# Patient Record
Sex: Male | Born: 1995 | Race: Black or African American | Hispanic: No | Marital: Single | Smoking: Never smoker
Health system: Southern US, Community
[De-identification: ages and names within clinical notes are randomized; demographics above are authoritative.]

## PROBLEM LIST (undated history)

## (undated) DIAGNOSIS — G43909 Migraine, unspecified, not intractable, without status migrainosus: Secondary | ICD-10-CM

## (undated) DIAGNOSIS — J302 Other seasonal allergic rhinitis: Secondary | ICD-10-CM

## (undated) HISTORY — DX: Migraine, unspecified, not intractable, without status migrainosus: G43.909

---
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
## 2018-04-13 ENCOUNTER — Encounter (HOSPITAL_COMMUNITY): Payer: Self-pay | Admitting: Emergency Medicine

## 2018-04-13 ENCOUNTER — Emergency Department (HOSPITAL_COMMUNITY)
Admission: EM | Admit: 2018-04-13 | Discharge: 2018-04-14 | Disposition: A | Discharge status: Home / Self Care | Payer: Medicaid Other | Attending: Emergency Medicine | Admitting: Emergency Medicine

## 2018-04-13 DIAGNOSIS — S83011A Lateral subluxation of right patella, initial encounter: Secondary | ICD-10-CM | POA: Insufficient documentation

## 2018-04-13 DIAGNOSIS — Y9289 Other specified places as the place of occurrence of the external cause: Secondary | ICD-10-CM | POA: Insufficient documentation

## 2018-04-13 DIAGNOSIS — Y999 Unspecified external cause status: Secondary | ICD-10-CM | POA: Insufficient documentation

## 2018-04-13 DIAGNOSIS — Y9341 Activity, dancing: Secondary | ICD-10-CM | POA: Insufficient documentation

## 2018-04-13 DIAGNOSIS — X58XXXA Exposure to other specified factors, initial encounter: Secondary | ICD-10-CM | POA: Insufficient documentation

## 2018-04-13 DIAGNOSIS — S83001A Unspecified subluxation of right patella, initial encounter: Secondary | ICD-10-CM

## 2018-04-13 NOTE — ED Triage Notes (Signed)
Pt states he "went to sit down and his knee popped". No redness or swelling noted on assessment. Pt states he feels pain on the inner top part of his right knee.

## 2018-04-14 ENCOUNTER — Emergency Department (HOSPITAL_COMMUNITY): Payer: Medicaid Other

## 2018-04-14 MED ORDER — ACETAMINOPHEN 500 MG PO TABS
1000.0000 mg | ORAL_TABLET | Freq: Once | ORAL | Status: AC
  Administered 2018-04-14: 1000 mg via ORAL
  Filled 2018-04-14: qty 2

## 2018-04-14 MED ORDER — IBUPROFEN 800 MG PO TABS
800.0000 mg | ORAL_TABLET | Freq: Once | ORAL | Status: AC
  Administered 2018-04-14: 800 mg via ORAL
  Filled 2018-04-14: qty 1

## 2018-04-14 MED ORDER — IBUPROFEN 600 MG PO TABS: 600.0000 mg | ORAL_TABLET | Freq: Four times a day (QID) | ORAL | 0 refills | Status: DC
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
## 2018-04-14 NOTE — Discharge Instructions (Addendum)
Your x-ray shows a subluxation of your patella on the right knee.  Please see Dr. Romeo Apple for orthopedic evaluation since you have had issues with left knee now the right knee.  Please use the knee immobilizer until you have been seen by Dr. Romeo Apple.  Use the crutches until you can safely put weight on the right lower extremity.  Please use 600 mg of ibuprofen with breakfast, lunch, dinner, and at bedtime.  Use the ice pack Saturday and Sunday.

## 2018-04-18 ENCOUNTER — Telehealth: Payer: Self-pay | Admitting: Orthopedic Surgery

## 2018-04-18 NOTE — Telephone Encounter (Signed)
Call received from patient today, 04/18/18 (first date called, following [facility] visit 04/13/2018), requesting to schedule appointment with Dr Romeo Apple for right knee problem. Offered first available, due to Dr Romeo Apple out of clinic this week; also offered appointment with Dr Hilda Lias this week. Patient opted to wait until next Tuesday, 03/24/2018, to see Dr Romeo Apple. Aware of appointment scheduled, and aware to call back if any concerns, as we have availability with Dr Hilda Lias.

## 2018-04-24 ENCOUNTER — Ambulatory Visit: Payer: Medicaid Other | Admitting: Orthopedic Surgery

## 2018-04-25 ENCOUNTER — Encounter: Payer: Self-pay | Admitting: Orthopedic Surgery

## 2018-05-25 ENCOUNTER — Emergency Department (HOSPITAL_COMMUNITY)
Admission: EM | Admit: 2018-05-25 | Discharge: 2018-05-25 | Disposition: A | Discharge status: Home / Self Care | Payer: Self-pay | Attending: Emergency Medicine | Admitting: Emergency Medicine

## 2018-05-25 ENCOUNTER — Encounter (HOSPITAL_COMMUNITY): Payer: Self-pay

## 2018-05-25 ENCOUNTER — Other Ambulatory Visit: Payer: Self-pay

## 2018-05-25 DIAGNOSIS — B9789 Other viral agents as the cause of diseases classified elsewhere: Secondary | ICD-10-CM

## 2018-05-25 DIAGNOSIS — J069 Acute upper respiratory infection, unspecified: Secondary | ICD-10-CM

## 2018-05-25 DIAGNOSIS — Z79899 Other long term (current) drug therapy: Secondary | ICD-10-CM | POA: Insufficient documentation

## 2018-05-25 DIAGNOSIS — R05 Cough: Secondary | ICD-10-CM | POA: Insufficient documentation

## 2018-05-25 DIAGNOSIS — R6883 Chills (without fever): Secondary | ICD-10-CM | POA: Insufficient documentation

## 2018-05-25 MED ORDER — BENZONATATE 100 MG PO CAPS: 100.0000 mg | ORAL_CAPSULE | Freq: Three times a day (TID) | ORAL | 0 refills | Status: DC | PRN

## 2018-05-25 MED ORDER — IBUPROFEN 800 MG PO TABS
800.0000 mg | ORAL_TABLET | Freq: Once | ORAL | Status: AC
  Administered 2018-05-25: 800 mg via ORAL
  Filled 2018-05-25: qty 1

## 2018-05-25 MED ORDER — BENZONATATE 100 MG PO CAPS
100.0000 mg | ORAL_CAPSULE | Freq: Once | ORAL | Status: AC
  Administered 2018-05-25: 100 mg via ORAL
  Filled 2018-05-25: qty 1

## 2018-05-25 NOTE — ED Notes (Signed)
Bed: WTR9 Expected date:  Expected time:  Means of arrival:  Comments: 

## 2018-05-25 NOTE — ED Triage Notes (Signed)
Pt reports cough that started at midnight. He states that it started while he was watching TV. Took mucinex about an hour ago and states no relief yet. Denies fever, chills or body aches. No travel, but states that he works at [employer] and comes in contact with a lot of people

## 2018-05-25 NOTE — Discharge Instructions (Signed)
You may alternate Tylenol 1000 mg every 6 hours as needed for pain and Ibuprofen 800 mg every 8 hours as needed for pain.  Please take Ibuprofen with food.  You can may continue medication such as guaifenesin and dextromethorphan over-the-counter as needed for cough.  You do not need antibiotics at this time.
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
## 2019-02-26 ENCOUNTER — Other Ambulatory Visit: Payer: Self-pay

## 2020-01-06 ENCOUNTER — Emergency Department (HOSPITAL_COMMUNITY)
Admission: EM | Admit: 2020-01-06 | Discharge: 2020-01-06 | Disposition: A | Discharge status: Home / Self Care | Payer: BC Managed Care – PPO | Attending: Emergency Medicine | Admitting: Emergency Medicine

## 2020-01-06 ENCOUNTER — Encounter (HOSPITAL_COMMUNITY): Payer: Self-pay | Admitting: Emergency Medicine

## 2020-01-06 ENCOUNTER — Other Ambulatory Visit: Payer: Self-pay

## 2020-01-06 DIAGNOSIS — R519 Headache, unspecified: Secondary | ICD-10-CM | POA: Diagnosis present

## 2020-01-06 DIAGNOSIS — R42 Dizziness and giddiness: Secondary | ICD-10-CM | POA: Insufficient documentation

## 2020-01-06 DIAGNOSIS — Y92410 Unspecified street and highway as the place of occurrence of the external cause: Secondary | ICD-10-CM | POA: Diagnosis not present

## 2020-01-06 MED ORDER — METHOCARBAMOL 500 MG PO TABS: 500.0000 mg | ORAL_TABLET | Freq: Four times a day (QID) | ORAL | 0 refills | Status: DC | PRN

## 2020-01-06 NOTE — Discharge Instructions (Addendum)
Please take Ibuprofen (Advil, motrin) and Tylenol (acetaminophen) to relieve your pain.  You may take up to 600 MG (3 pills) of normal strength ibuprofen every 8 hours as needed.  In between doses of ibuprofen you make take tylenol, up to 1,000 mg (two extra strength pills).  Do not take more than 3,000 mg tylenol in a 24 hour period.  Please check all medication labels as many medications such as pain and cold medications may contain tylenol.  Do not drink alcohol while taking these medications.  Do not take other NSAID'S while taking ibuprofen (such as aleve or naproxen).  Please take ibuprofen with food to decrease stomach upset.  You are being prescribed a medication which may make you sleepy. For 24 hours after one dose please do not drive, operate heavy machinery, care for a small child with out another adult present, or perform any activities that may cause harm to you or someone else if you were to fall asleep or be impaired.   The best way to get rid of muscle pain is by taking NSAIDS, using heat, massage therapy, and gentle stretching/range of motion exercises.  
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
## 2020-03-17 IMAGING — DX DG KNEE COMPLETE 4+V*R*
4 series · 4 of 4 positions shown · non-contrast
Comparison: None.

CLINICAL DATA: Knee popped with pain in the top part of right knee.

EXAM:
RIGHT KNEE - COMPLETE 4+ VIEW

[knee ap (1 of 3)]
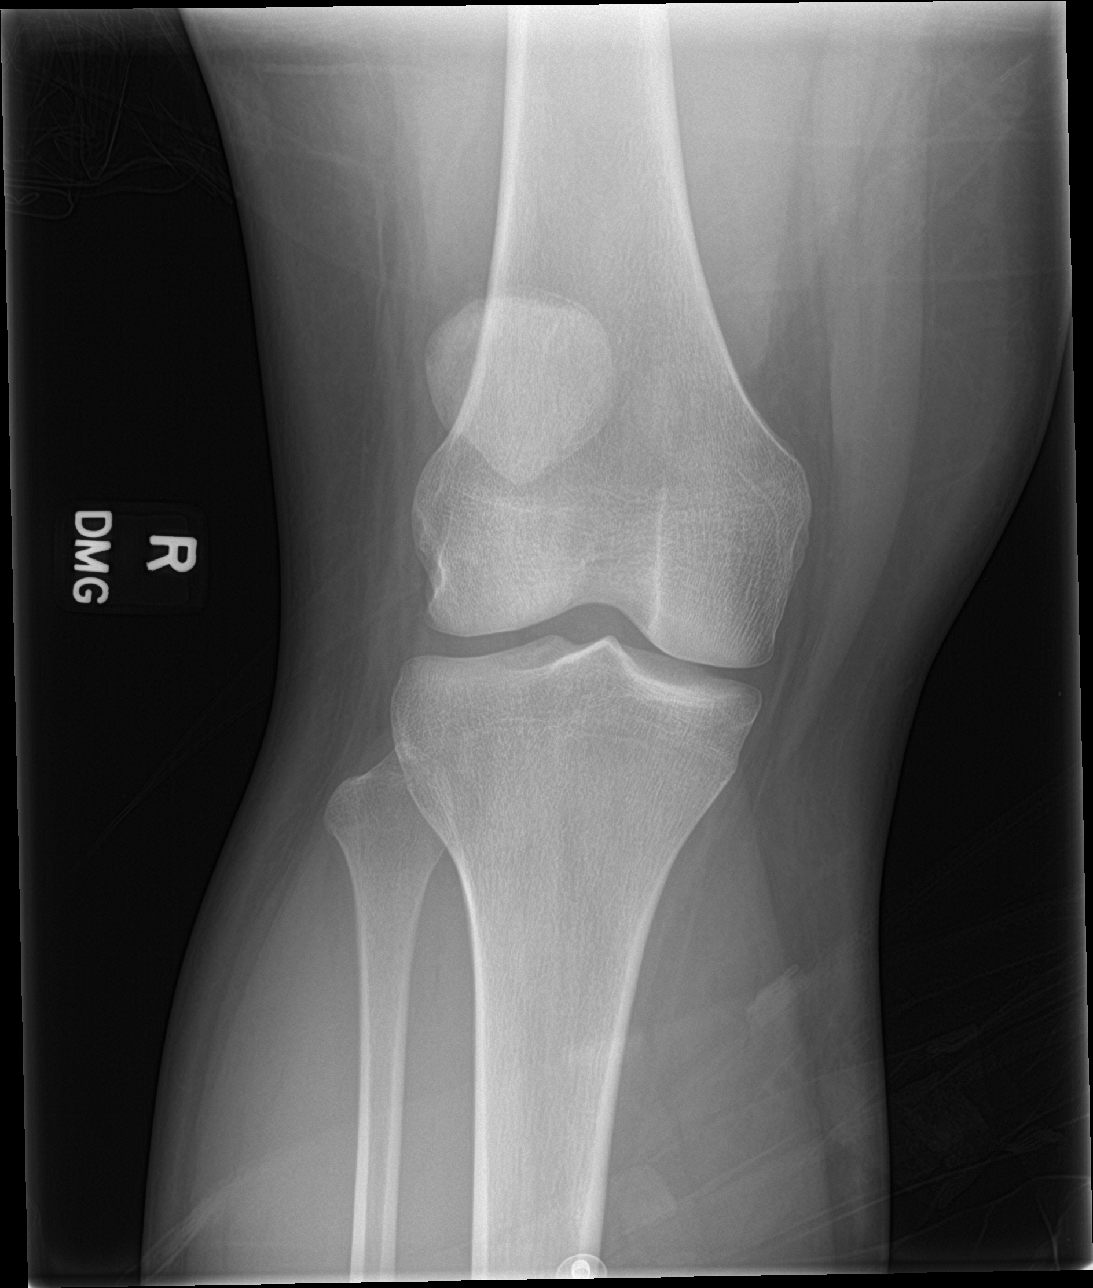

[knee lat]
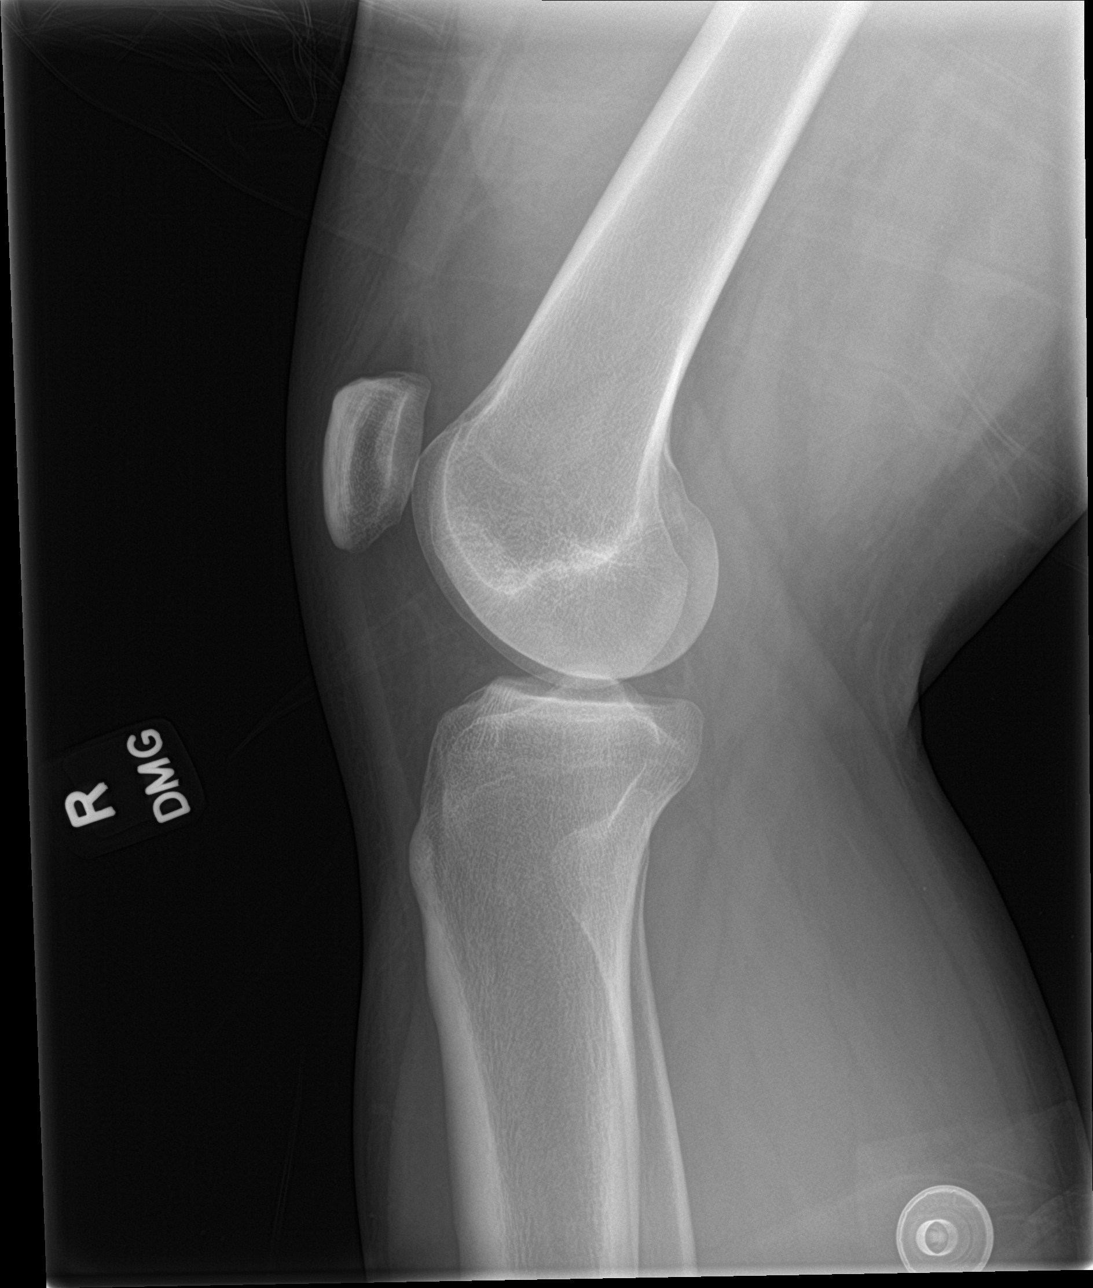

[knee ap (2 of 3)]
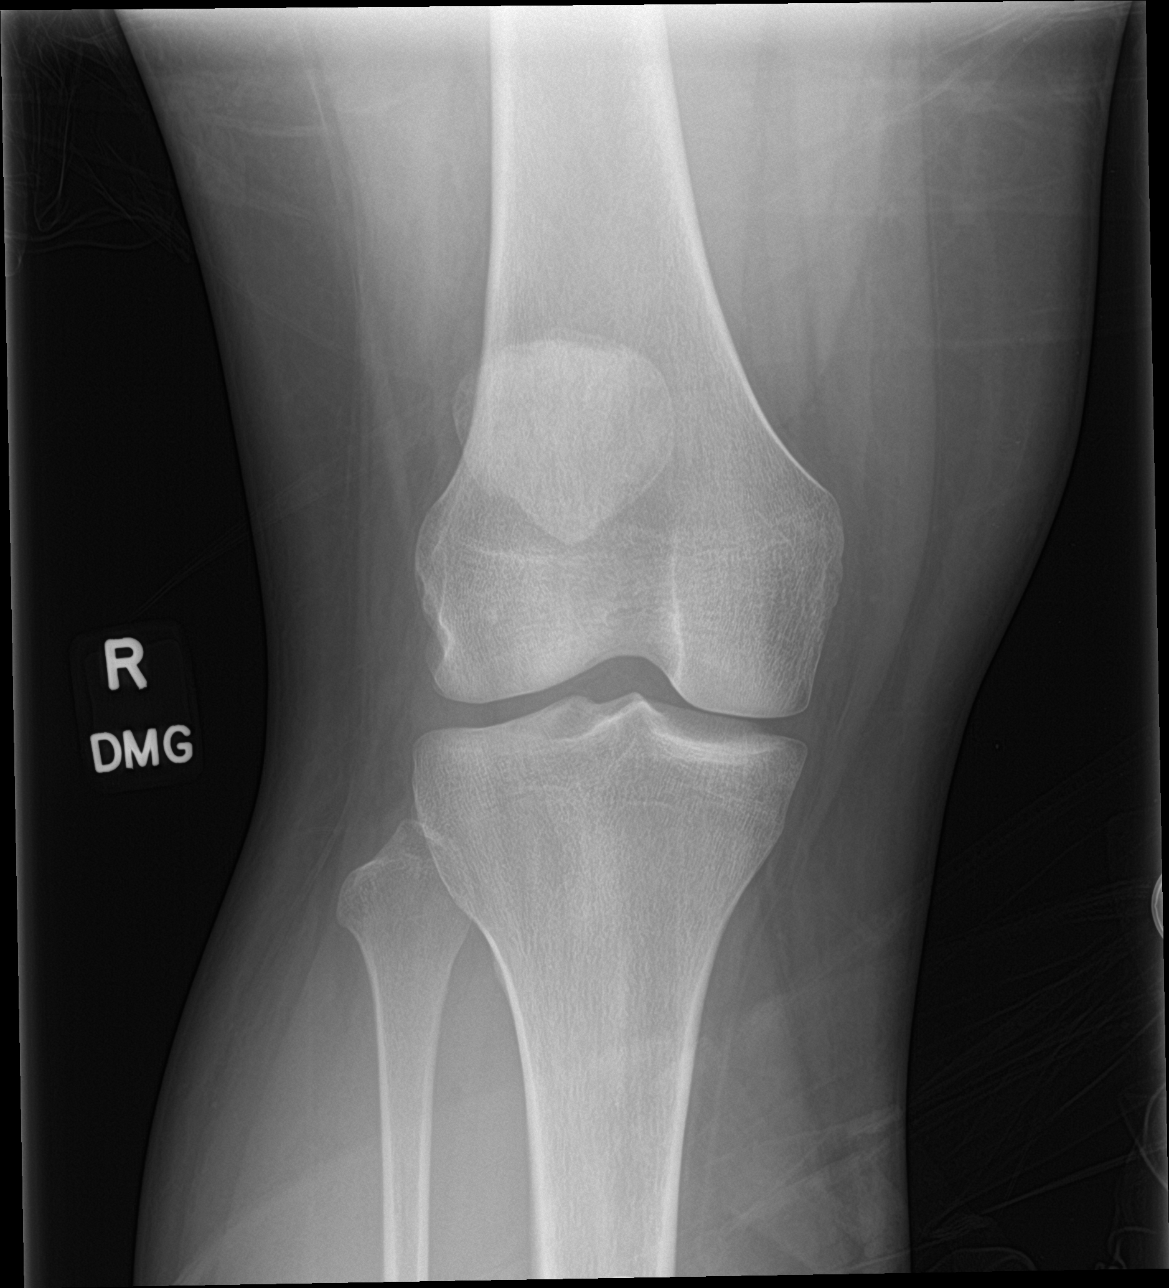

[knee ap (3 of 3)]
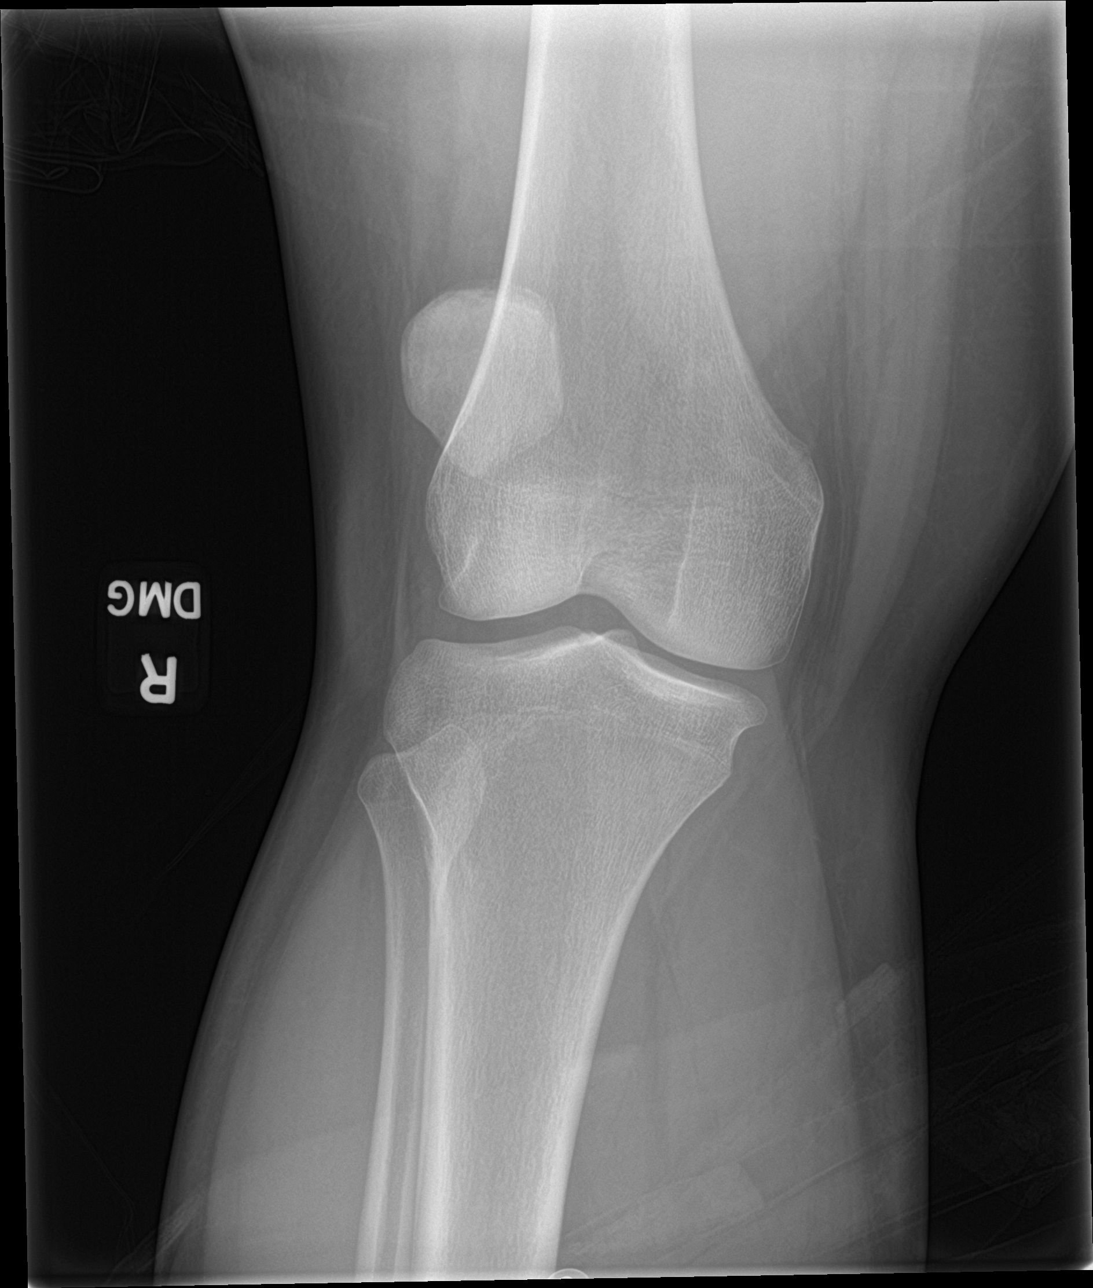

[4 of 4 positions shown; findings below may reference images not displayed]

FINDINGS: Lateral subluxation of the patella is noted without definite
dislocation. No fracture is identified. No significant joint
effusion.
IMPRESSION: Laterally subluxed appearance of the patella. No acute fracture or
joint dislocation.

## 2022-02-16 DIAGNOSIS — A749 Chlamydial infection, unspecified: Secondary | ICD-10-CM | POA: Diagnosis not present

## 2022-02-25 DIAGNOSIS — Z118 Encounter for screening for other infectious and parasitic diseases: Secondary | ICD-10-CM | POA: Diagnosis not present

## 2022-02-25 DIAGNOSIS — I10 Essential (primary) hypertension: Secondary | ICD-10-CM | POA: Diagnosis not present

## 2022-05-09 DIAGNOSIS — J029 Acute pharyngitis, unspecified: Secondary | ICD-10-CM | POA: Diagnosis not present

## 2023-02-21 ENCOUNTER — Ambulatory Visit: Payer: BC Managed Care – PPO | Admitting: Internal Medicine

## 2023-02-23 ENCOUNTER — Encounter: Payer: Self-pay | Admitting: Internal Medicine

## 2023-02-23 ENCOUNTER — Ambulatory Visit: Payer: BC Managed Care – PPO | Admitting: Internal Medicine

## 2023-02-23 VITALS — BP 122/72 | HR 85 | Ht 64.0 in | Wt 226.4 lb

## 2023-02-23 DIAGNOSIS — E669 Obesity, unspecified: Secondary | ICD-10-CM

## 2023-02-23 DIAGNOSIS — Z1321 Encounter for screening for nutritional disorder: Secondary | ICD-10-CM

## 2023-02-23 DIAGNOSIS — Z1329 Encounter for screening for other suspected endocrine disorder: Secondary | ICD-10-CM | POA: Diagnosis not present

## 2023-02-23 DIAGNOSIS — Z1159 Encounter for screening for other viral diseases: Secondary | ICD-10-CM | POA: Diagnosis not present

## 2023-02-23 DIAGNOSIS — Z114 Encounter for screening for human immunodeficiency virus [HIV]: Secondary | ICD-10-CM

## 2023-02-23 DIAGNOSIS — Z131 Encounter for screening for diabetes mellitus: Secondary | ICD-10-CM

## 2023-02-23 DIAGNOSIS — Z13220 Encounter for screening for lipoid disorders: Secondary | ICD-10-CM

## 2023-02-23 DIAGNOSIS — G43009 Migraine without aura, not intractable, without status migrainosus: Secondary | ICD-10-CM

## 2023-02-23 DIAGNOSIS — G43909 Migraine, unspecified, not intractable, without status migrainosus: Secondary | ICD-10-CM | POA: Insufficient documentation

## 2023-02-23 DIAGNOSIS — Z23 Encounter for immunization: Secondary | ICD-10-CM | POA: Insufficient documentation

## 2023-02-23 DIAGNOSIS — D529 Folate deficiency anemia, unspecified: Secondary | ICD-10-CM | POA: Diagnosis not present

## 2023-02-23 DIAGNOSIS — D519 Vitamin B12 deficiency anemia, unspecified: Secondary | ICD-10-CM | POA: Diagnosis not present

## 2023-02-23 NOTE — Assessment & Plan Note (Signed)
He endorses a history of migraine headaches and is prescribed Imitrex for as needed migraine relief.  Headaches occur less than 15 days/month and Imitrex is effective.

## 2023-02-23 NOTE — Patient Instructions (Signed)
It was a pleasure to see you today.  Thank you for giving [place] the opportunity to be involved in your care.  Below is a brief recap of your visit and next steps.  We will plan to see you again in 1 year.  Summary You have established care today No medication changes were made We will check basic labs Flu shot today Follow up in 1 year for annual exam

## 2023-02-23 NOTE — Assessment & Plan Note (Signed)
Influenza vaccine administered today.

## 2023-02-23 NOTE — Progress Notes (Signed)
New Patient Office Visit  Subjective    Patient ID: Gabriel Hubbard, male    DOB: 05-08-1995  Age: 27 y.o. MRN: [id number]  CC:  Chief Complaint  Patient presents with   Establish Care    HPI LANKFORD PARI presents to establish care.  He is a 27 year old male who endorses a past medical history significant for migraines.  Previously followed by Dr. Sudie Bailey.  Mr. Burge reports feeling well today.  He endorses nasal congestion but is otherwise asymptomatic and has no acute concerns to discuss aside from desiring to establish care.  He currently works as a Public relations account executive.  Denies tobacco and illicit drug use.  Endorses social alcohol consumption.  Family medical history is significant for hypertension.  Chronic medical conditions and outstanding preventative care items discussed today are individually addressed in A/P below.   Outpatient Encounter Medications as of 02/23/2023  Medication Sig   SUMAtriptan (IMITREX) 100 MG tablet Take by mouth.   [DISCONTINUED] benzonatate (TESSALON) 100 MG capsule Take 1 capsule (100 mg total) by mouth 3 (three) times daily as needed for cough. (Patient not taking: Reported on 02/23/2023)   [DISCONTINUED] cetirizine (ZYRTEC) 10 MG tablet Take 10 mg by mouth daily as needed for allergies. (Patient not taking: Reported on 02/23/2023)   [DISCONTINUED] famotidine (PEPCID) 20 MG tablet Take 1 tablet (20 mg total) by mouth 2 (two) times daily. (Patient not taking: Reported on 02/23/2023)   [DISCONTINUED] ibuprofen (ADVIL,MOTRIN) 600 MG tablet Take 1 tablet (600 mg total) by mouth 4 (four) times daily. (Patient not taking: Reported on 02/23/2023)   [DISCONTINUED] methocarbamol (ROBAXIN) 500 MG tablet Take 1 tablet (500 mg total) by mouth every 6 (six) hours as needed for muscle spasms. (Patient not taking: Reported on 02/23/2023)   [DISCONTINUED] omeprazole (PRILOSEC) 20 MG capsule Take 1 capsule (20 mg total) by mouth daily. (Patient not taking: Reported on  02/23/2023)   No facility-administered encounter medications on file as of 02/23/2023.    Past Medical History:  Diagnosis Date   Migraine    Seasonal allergies     History reviewed. No pertinent surgical history.  History reviewed. No pertinent family history.  Social History   Socioeconomic History   Marital status: Single    Spouse name: Not on file   Number of children: Not on file   Years of education: Not on file   Highest education level: Not on file  Occupational History   Not on file  Tobacco Use   Smoking status: Never   Smokeless tobacco: Never  Substance and Sexual Activity   Alcohol use: Yes   Drug use: No   Sexual activity: Not on file  Other Topics Concern   Not on file  Social History Narrative   Not on file   Social Drivers of Health   Financial Resource Strain: Not on file  Food Insecurity: Not on file  Transportation Needs: Not on file  Physical Activity: Not on file  Stress: Not on file  Social Connections: Not on file  Intimate Partner Violence: Not on file   Review of Systems  Constitutional:  Negative for chills and fever.  HENT:  Positive for congestion. Negative for sore throat.   Respiratory:  Negative for cough and shortness of breath.   Cardiovascular:  Negative for chest pain, palpitations and leg swelling.  Gastrointestinal:  Negative for abdominal pain, blood in stool, constipation, diarrhea, nausea and vomiting.  Genitourinary:  Negative for dysuria and hematuria.  Musculoskeletal:  Negative for myalgias.  Skin:  Negative for itching and rash.  Neurological:  Negative for dizziness and headaches.  Psychiatric/Behavioral:  Negative for depression and suicidal ideas.    Objective    BP 122/72   Pulse 85   Ht 5\' 4"  (1.626 m)   Wt 226 lb 6.4 oz (102.7 kg)   SpO2 96%   BMI 38.86 kg/m   Physical Exam Vitals reviewed.  Constitutional:      General: He is not in acute distress.    Appearance: Normal appearance. He is  obese. He is not ill-appearing.  HENT:     Head: Normocephalic and atraumatic.     Right Ear: External ear normal.     Left Ear: External ear normal.     Nose: Congestion present. No rhinorrhea.     Mouth/Throat:     Mouth: Mucous membranes are moist.     Pharynx: Oropharynx is clear.  Eyes:     General: No scleral icterus.    Extraocular Movements: Extraocular movements intact.     Conjunctiva/sclera: Conjunctivae normal.     Pupils: Pupils are equal, round, and reactive to light.  Cardiovascular:     Rate and Rhythm: Normal rate and regular rhythm.     Pulses: Normal pulses.     Heart sounds: Normal heart sounds. No murmur heard. Pulmonary:     Effort: Pulmonary effort is normal.     Breath sounds: Normal breath sounds. No wheezing, rhonchi or rales.  Abdominal:     General: Abdomen is flat. Bowel sounds are normal. There is no distension.     Palpations: Abdomen is soft.     Tenderness: There is no abdominal tenderness.  Musculoskeletal:        General: No swelling or deformity. Normal range of motion.     Cervical back: Normal range of motion.  Skin:    General: Skin is warm and dry.     Capillary Refill: Capillary refill takes less than 2 seconds.  Neurological:     General: No focal deficit present.     Mental Status: He is alert and oriented to person, place, and time.     Motor: No weakness.  Psychiatric:        Mood and Affect: Mood normal.        Behavior: Behavior normal.        Thought Content: Thought content normal.    Assessment & Plan:   Problem List Items Addressed This Visit       Migraines   He endorses a history of migraine headaches and is prescribed Imitrex for as needed migraine relief.  Headaches occur less than 15 days/month and Imitrex is effective.      Need for influenza vaccination   Influenza vaccine administered today      Return in about 1 year (around 02/23/2024).   Billie Lade, MD

## 2023-02-24 ENCOUNTER — Other Ambulatory Visit: Payer: Self-pay | Admitting: Internal Medicine

## 2023-02-24 DIAGNOSIS — E559 Vitamin D deficiency, unspecified: Secondary | ICD-10-CM

## 2023-02-24 LAB — CMP14+EGFR
ALT: 33 [IU]/L (ref 0–44)
AST: 28 [IU]/L (ref 0–40)
Albumin: 4.6 g/dL (ref 4.3–5.2)
Alkaline Phosphatase: 63 [IU]/L (ref 44–121)
BUN/Creatinine Ratio: 9 (ref 9–20)
BUN: 8 mg/dL (ref 6–20)
Bilirubin Total: 0.3 mg/dL (ref 0.0–1.2)
CO2: 26 mmol/L (ref 20–29)
Calcium: 9.7 mg/dL (ref 8.7–10.2)
Chloride: 102 mmol/L (ref 96–106)
Creatinine, Ser: 0.93 mg/dL (ref 0.76–1.27)
Globulin, Total: 2.8 g/dL (ref 1.5–4.5)
Glucose: 81 mg/dL (ref 70–99)
Potassium: 4.1 mmol/L (ref 3.5–5.2)
Sodium: 139 mmol/L (ref 134–144)
Total Protein: 7.4 g/dL (ref 6.0–8.5)
eGFR: 117 mL/min/{1.73_m2} (ref >59)

## 2023-02-24 LAB — CBC WITH DIFFERENTIAL/PLATELET
Basophils Absolute: 0.1 10*3/uL (ref 0.0–0.2)
Basos: 1 %
EOS (ABSOLUTE): 0.4 10*3/uL (ref 0.0–0.4)
Eos: 4 %
Hematocrit: 42.4 % (ref 37.5–51.0)
Hemoglobin: 13.4 g/dL (ref 13.0–17.7)
Immature Grans (Abs): 0 10*3/uL (ref 0.0–0.1)
Immature Granulocytes: 0 %
Lymphocytes Absolute: 1.3 10*3/uL (ref 0.7–3.1)
Lymphs: 15 %
MCH: 25.7 pg — ABNORMAL LOW (ref 26.6–33.0)
MCHC: 31.6 g/dL (ref 31.5–35.7)
MCV: 81 fL (ref 79–97)
Monocytes Absolute: 0.8 10*3/uL (ref 0.1–0.9)
Monocytes: 9 %
Neutrophils Absolute: 6.2 10*3/uL (ref 1.4–7.0)
Neutrophils: 71 %
Platelets: 278 10*3/uL (ref 150–450)
RBC: 5.22 x10E6/uL (ref 4.14–5.80)
RDW: 13.8 % (ref 11.6–15.4)
WBC: 8.7 10*3/uL (ref 3.4–10.8)

## 2023-02-24 LAB — HCV AB W REFLEX TO QUANT PCR: HCV Ab: NONREACTIVE

## 2023-02-24 LAB — VITAMIN D 25 HYDROXY (VIT D DEFICIENCY, FRACTURES): Vit D, 25-Hydroxy: 10.7 ng/mL — ABNORMAL LOW (ref 30.0–100.0)

## 2023-02-24 LAB — HIV ANTIBODY (ROUTINE TESTING W REFLEX): HIV Screen 4th Generation wRfx: NONREACTIVE

## 2023-02-24 LAB — TSH+FREE T4
Free T4: 1.18 ng/dL (ref 0.82–1.77)
TSH: 0.777 u[IU]/mL (ref 0.450–4.500)

## 2023-02-24 LAB — B12 AND FOLATE PANEL
Folate: 10.5 ng/mL (ref >3.0)
Vitamin B-12: 546 pg/mL (ref 232–1245)

## 2023-02-24 LAB — LIPID PANEL
Chol/HDL Ratio: 4 {ratio} (ref 0.0–5.0)
Cholesterol, Total: 148 mg/dL (ref 100–199)
HDL: 37 mg/dL — ABNORMAL LOW (ref >39)
LDL Chol Calc (NIH): 94 mg/dL (ref 0–99)
Triglycerides: 89 mg/dL (ref 0–149)
VLDL Cholesterol Cal: 17 mg/dL (ref 5–40)

## 2023-02-24 LAB — HEMOGLOBIN A1C
Est. average glucose Bld gHb Est-mCnc: 117 mg/dL
Hgb A1c MFr Bld: 5.7 % — ABNORMAL HIGH (ref 4.8–5.6)

## 2023-02-24 LAB — HCV INTERPRETATION

## 2023-02-24 MED ORDER — VITAMIN D (ERGOCALCIFEROL) 1.25 MG (50000 UNIT) PO CAPS: 50000.0000 [IU] | ORAL_CAPSULE | ORAL | 0 refills | Status: AC

## 2023-09-03 ENCOUNTER — Emergency Department (HOSPITAL_COMMUNITY)
Admission: EM | Admit: 2023-09-03 | Discharge: 2023-09-03 | Disposition: A | Discharge status: Home / Self Care | Attending: Emergency Medicine | Admitting: Emergency Medicine

## 2023-09-03 ENCOUNTER — Other Ambulatory Visit: Payer: Self-pay

## 2023-09-03 ENCOUNTER — Encounter (HOSPITAL_COMMUNITY): Payer: Self-pay

## 2023-09-03 DIAGNOSIS — R519 Headache, unspecified: Secondary | ICD-10-CM | POA: Diagnosis not present

## 2023-09-03 DIAGNOSIS — L739 Follicular disorder, unspecified: Secondary | ICD-10-CM | POA: Diagnosis not present

## 2023-09-03 MED ORDER — DOXYCYCLINE HYCLATE 100 MG PO TABS
100.0000 mg | ORAL_TABLET | Freq: Once | ORAL | Status: AC
  Administered 2023-09-03: 100 mg via ORAL
  Filled 2023-09-03: qty 1

## 2023-09-03 MED ORDER — DOXYCYCLINE HYCLATE 100 MG PO CAPS
100.0000 mg | ORAL_CAPSULE | Freq: Two times a day (BID) | ORAL | 0 refills | Status: AC
Start: 2023-09-03 — End: ?

## 2023-09-03 NOTE — ED Triage Notes (Signed)
 Pt with abscess to the back of left of head x4 days. Drainage present, headache, denies fevers.

## 2023-09-03 NOTE — Discharge Instructions (Signed)
 As discussed, apply warm wet compresses on and off to the area.  Take the antibiotic as directed until finished.  You may also take over-the-counter ibuprofen , 600 to 800 mg 3 times a day with food to help with discomfort.  Your lymph nodes of your neck are swollen as your body is trying to fight the infection.  The swelling in your lymph nodes should improve within a week or so.  Follow-up with your primary care provider for recheck or return to the emergency department for any new or worsening symptoms.

## 2023-09-06 NOTE — ED Provider Notes (Signed)
 [place] EMERGENCY DEPARTMENT AT [facility] Provider Note   CSN: [id number] Arrival date & time: 09/03/23  2055     Patient presents with: No chief complaint on file.   Gabriel Hubbard is a 28 y.o. male.   HPI      Gabriel Hubbard is a 28 y.o. male who presents to the Emergency Department complaining of localized area of tenderness to the left scalp x 4 days.  States the area has opened up and and draining purulent material.  He describes aching pain to the left side of scalp.  He has also noticed several enlarged nodules along his left neck and posterior scalp.  He denies any fever chills nausea vomiting visual changes and neck stiffness.   Prior to Admission medications   Medication Sig Start Date End Date Taking? Authorizing Provider  doxycycline (VIBRAMYCIN) 100 MG capsule Take 1 capsule (100 mg total) by mouth 2 (two) times daily. 09/03/23  Yes Luretha Eberly, PA-C  SUMAtriptan (IMITREX) 100 MG tablet Take by mouth. 12/17/22   [provider]    Allergies: Patient has no known allergies.    Review of Systems  Constitutional:  Negative for appetite change, chills and fever.  HENT:  Negative for ear pain, facial swelling and trouble swallowing.   Eyes:  Negative for visual disturbance.  Gastrointestinal:  Negative for abdominal pain, nausea and vomiting.  Musculoskeletal:  Positive for neck pain. Negative for neck stiffness.  Skin:  Positive for wound.  Neurological:  Positive for headaches. Negative for dizziness, syncope and weakness.  Hematological:  Positive for adenopathy.    Updated Vital Signs BP (!) 145/84 (BP Location: Right Arm)   Pulse 76   Temp 98.2 F (36.8 C)   Resp 16   Wt 99.8 kg   SpO2 97%   BMI 37.76 kg/m   Physical Exam Vitals and nursing note reviewed.  Constitutional:      General: He is not in acute distress.    Appearance: Normal appearance. He is not toxic-appearing.  HENT:     Head:     Comments: Patient has 2  cm area of tenderness left scalp with mild induration, clear to purulent drainage.  No fluctuance or induration. Neck:     Trachea: Phonation normal.     Meningeal: Kernig's sign absent.   Cardiovascular:     Rate and Rhythm: Normal rate and regular rhythm.     Pulses: Normal pulses.  Pulmonary:     Effort: Pulmonary effort is normal.     Breath sounds: Normal breath sounds.   Musculoskeletal:        General: Normal range of motion.     Cervical back: Muscular tenderness present.  Lymphadenopathy:     Cervical: Cervical adenopathy present.     Right cervical: No superficial or posterior cervical adenopathy.    Left cervical: Superficial cervical adenopathy and posterior cervical adenopathy present.   Skin:    General: Skin is warm.   Neurological:     General: No focal deficit present.     Mental Status: He is alert.     Sensory: No sensory deficit.     Motor: No weakness.     (all labs ordered are listed, but only abnormal results are displayed) Labs Reviewed - No data to display  EKG: None  Radiology: No results found.   Procedures   Medications Ordered in the ED  doxycycline (VIBRA-TABS) tablet 100 mg (100 mg Oral Given 09/03/23 2203)  Medical Decision Making Patient here with localized area of pain and purulent drainage left scalp for several days.  He has noticed several areas of swelling and tenderness along the left cervical chain.  There is no erythema induration or fluctuance of the neck or scalp.  Patient is well-appearing.  Father at bedside states that he removed an embedded hair from the area a few days ago.   Patient well-appearing nontoxic.  Likely folliculitis versus abscess versus keloid.  Tenderness in areas of swelling along the neck are likely enlarged lymph nodes.  No indication for I&D at this time as areas are currently draining nicely  Amount and/or Complexity of Data Reviewed Discussion of management  or test interpretation with external provider(s): Patient with likely folliculitis with abscess.  Doubt keloid.  Secondary lymphadenopathy.  I recommended warm compresses, prescription for antibiotics and outpatient follow-up for recheck to ensure resolution of symptoms  Given return precautions if symptoms worsen  Risk Prescription drug management.        Final diagnoses:  Folliculitis    ED Discharge Orders          Ordered    doxycycline (VIBRAMYCIN) 100 MG capsule  2 times daily        09/03/23 2144               Herlinda Milling, PA-C 09/06/23 1655    Dean Clarity, MD 09/07/23 1501

## 2023-10-20 DIAGNOSIS — Z6841 Body Mass Index (BMI) 40.0 and over, adult: Secondary | ICD-10-CM | POA: Diagnosis not present

## 2023-10-20 DIAGNOSIS — L03119 Cellulitis of unspecified part of limb: Secondary | ICD-10-CM | POA: Diagnosis not present

## 2023-10-20 DIAGNOSIS — R030 Elevated blood-pressure reading, without diagnosis of hypertension: Secondary | ICD-10-CM | POA: Diagnosis not present

## 2024-02-26 ENCOUNTER — Ambulatory Visit: Payer: BC Managed Care – PPO | Admitting: Internal Medicine
